# Patient Record
Sex: Female | Born: 1950 | Race: Black or African American | Hispanic: No | Smoking: Never smoker
Health system: Southern US, Community
[De-identification: ages and names within clinical notes are randomized; demographics above are authoritative.]

## PROBLEM LIST (undated history)

## (undated) HISTORY — PX: APPENDECTOMY: SHX54

---

## 1997-03-09 ENCOUNTER — Ambulatory Visit (HOSPITAL_COMMUNITY): Admission: RE | Admit: 1997-03-09 | Discharge: 1997-03-09 | Payer: Self-pay | Admitting: Obstetrics & Gynecology

## 1997-05-13 ENCOUNTER — Ambulatory Visit (HOSPITAL_COMMUNITY): Admission: RE | Admit: 1997-05-13 | Discharge: 1997-05-13 | Payer: Self-pay | Admitting: Obstetrics & Gynecology

## 2002-06-19 ENCOUNTER — Emergency Department (HOSPITAL_COMMUNITY): Admission: EM | Admit: 2002-06-19 | Discharge: 2002-06-19 | Payer: Self-pay | Admitting: Emergency Medicine

## 2002-06-19 ENCOUNTER — Encounter: Payer: Self-pay | Admitting: Emergency Medicine

## 2003-08-03 ENCOUNTER — Encounter: Admission: RE | Admit: 2003-08-03 | Discharge: 2003-11-01 | Payer: Self-pay | Admitting: Internal Medicine

## 2004-12-12 ENCOUNTER — Ambulatory Visit (HOSPITAL_COMMUNITY): Admission: RE | Admit: 2004-12-12 | Discharge: 2004-12-12 | Payer: Self-pay | Admitting: General Surgery

## 2005-07-04 ENCOUNTER — Emergency Department (HOSPITAL_COMMUNITY): Admission: EM | Admit: 2005-07-04 | Discharge: 2005-07-05 | Payer: Self-pay | Admitting: Emergency Medicine

## 2005-07-16 ENCOUNTER — Ambulatory Visit (HOSPITAL_COMMUNITY): Admission: RE | Admit: 2005-07-16 | Discharge: 2005-07-16 | Payer: Self-pay | Admitting: General Surgery

## 2006-08-14 ENCOUNTER — Other Ambulatory Visit: Admission: RE | Admit: 2006-08-14 | Discharge: 2006-08-14 | Payer: Self-pay | Admitting: Family Medicine

## 2007-03-25 IMAGING — US US PELVIS COMPLETE MODIFY
1 series · 14 of 25 positions shown · non-contrast
Comparison: None.

CLINICAL DATA: 54-year-old, pelvic pain.  
 TRANSABDOMINAL AND TRANSVAGINAL PELVIC ULTRASOUND:
TECHNIQUE: Both transabdominal and transvaginal ultrasound examinations of the pelvis were performed including evaluation of the uterus, ovaries, adnexal regions, and pelvic cul-de-sac.

[Series 1: us pelvis complete modify · 14 of 34 slices shown]
[im 1/34]
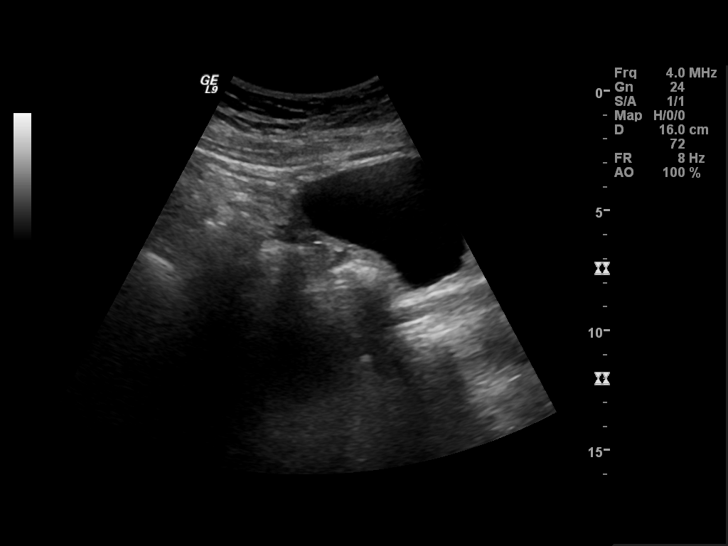
[im 3/34]
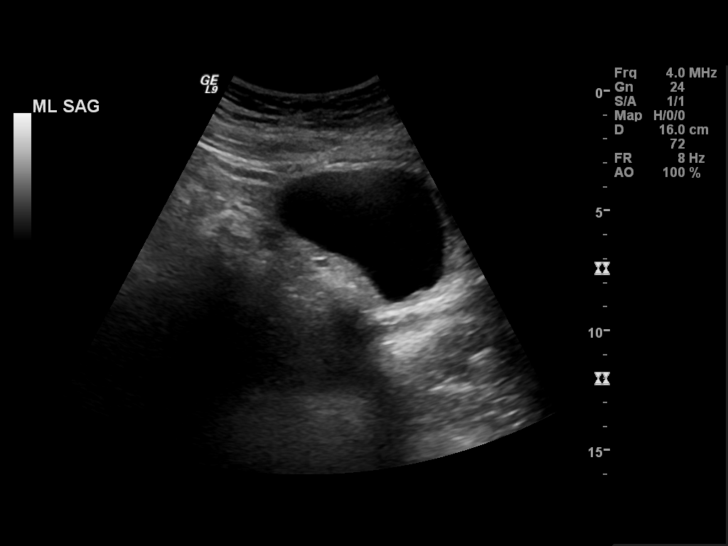
[im 6/34]
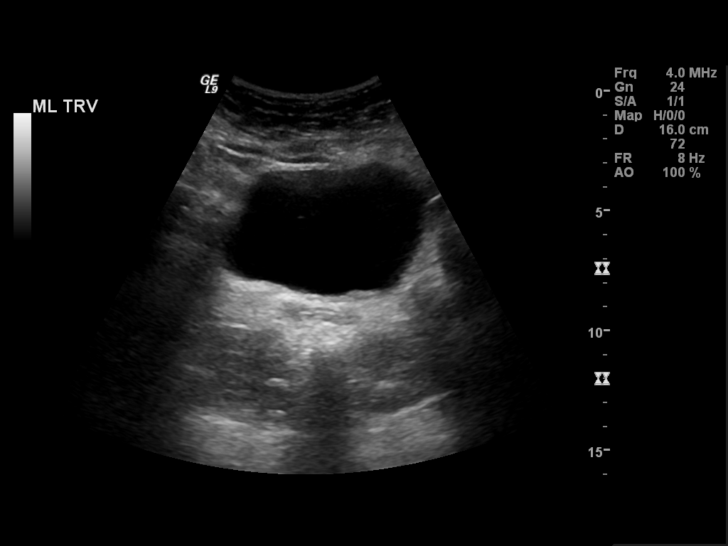
[im 9/34]
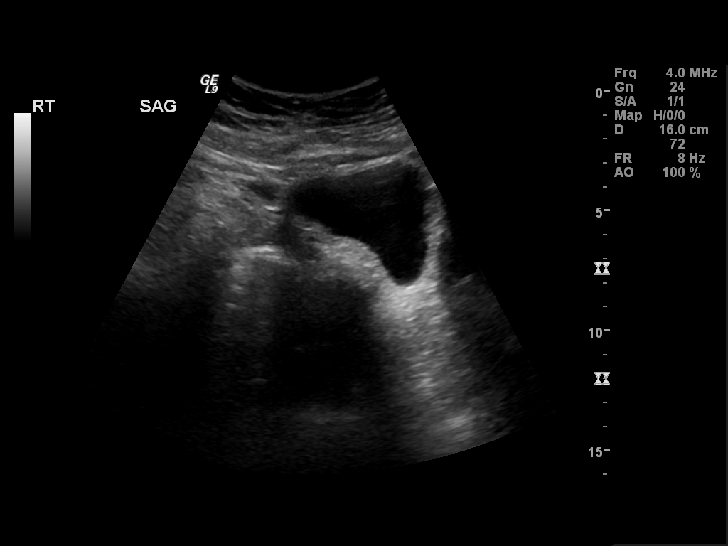
[im 12/34]
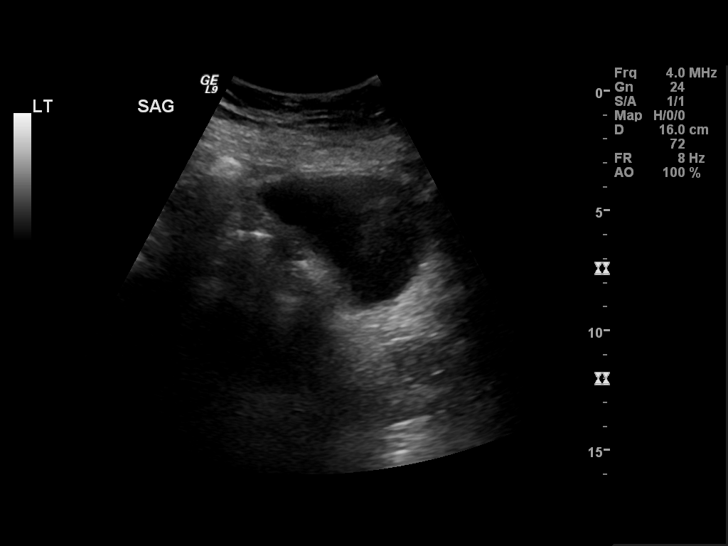
[im 13/34]
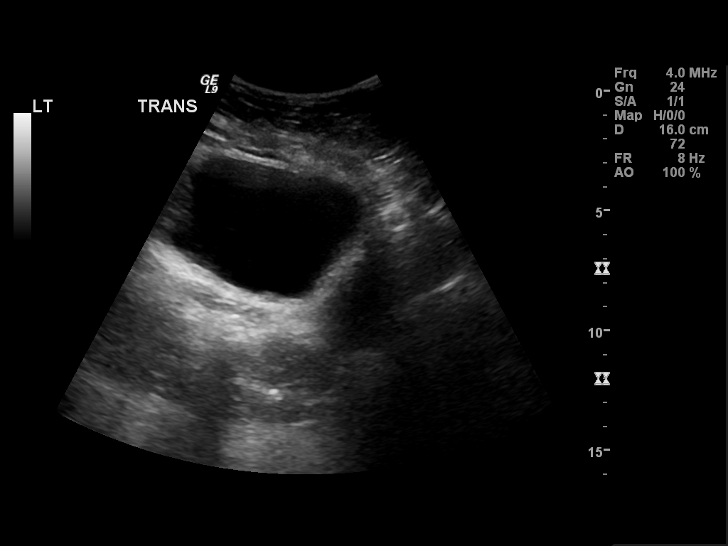
[im 16/34]
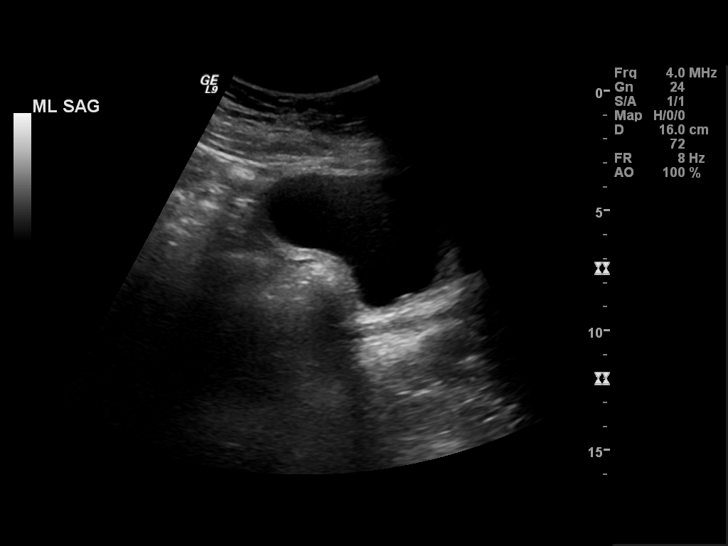
[im 18/34]
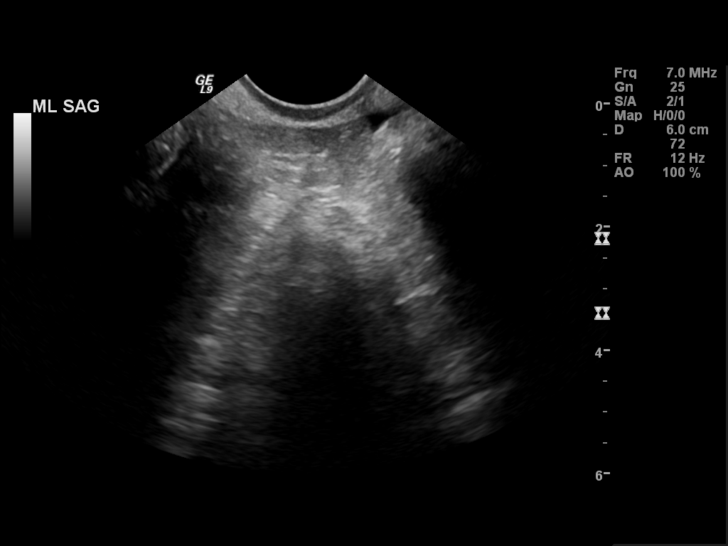
[im 21/34]
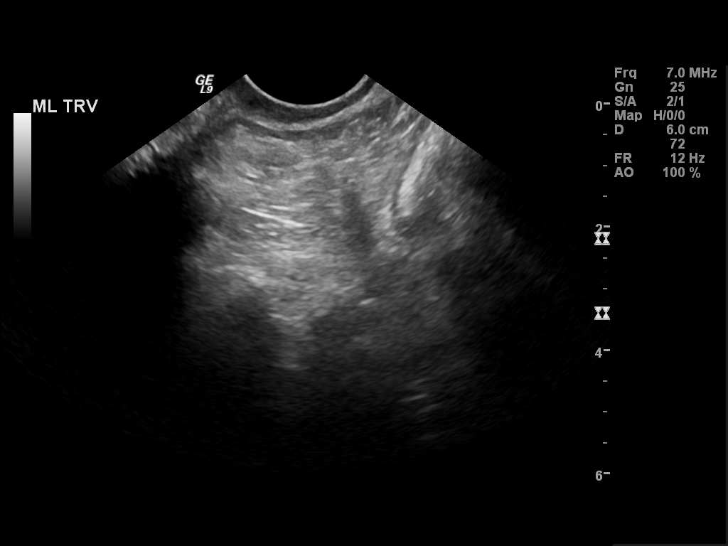
[im 23/34]
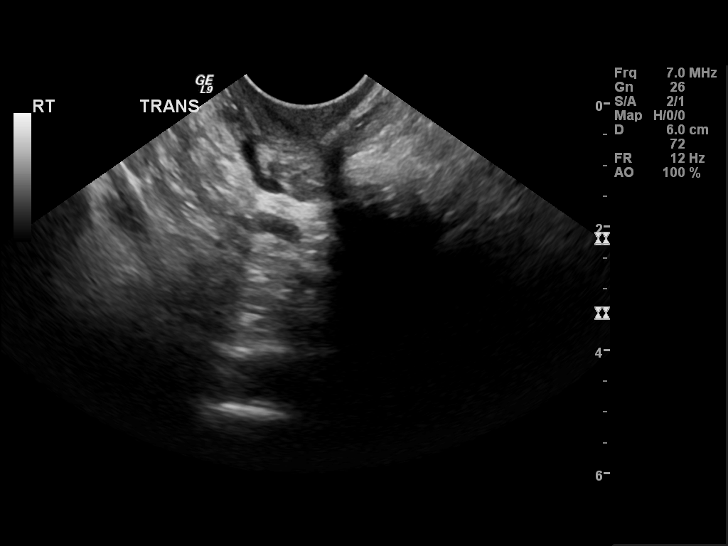
[im 25/34]
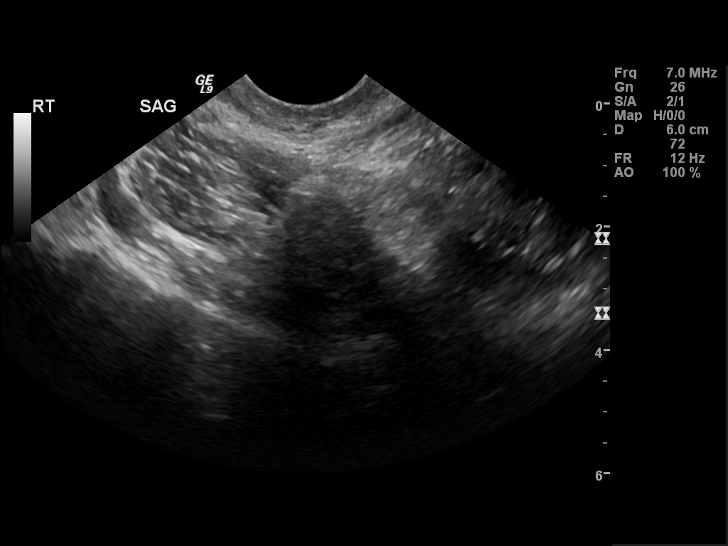
[im 28/34]
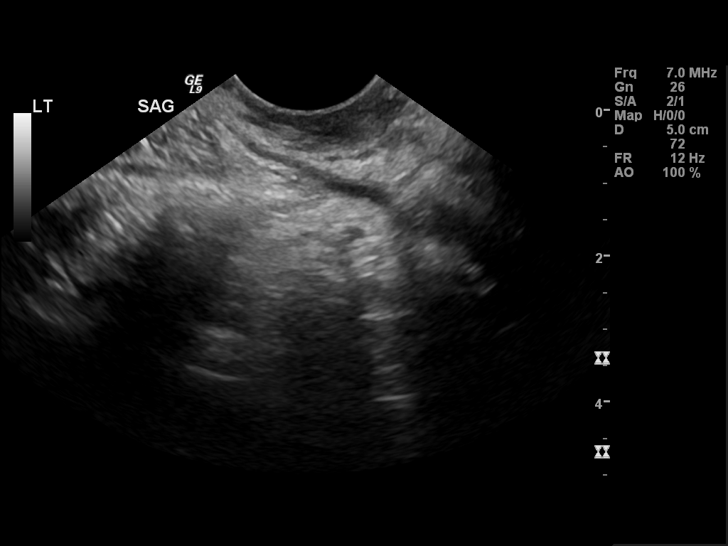
[im 31/34]
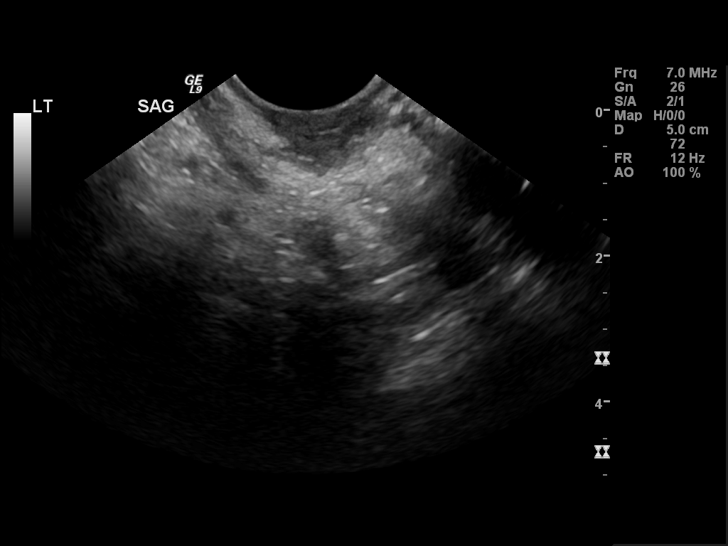
[im 34/34]
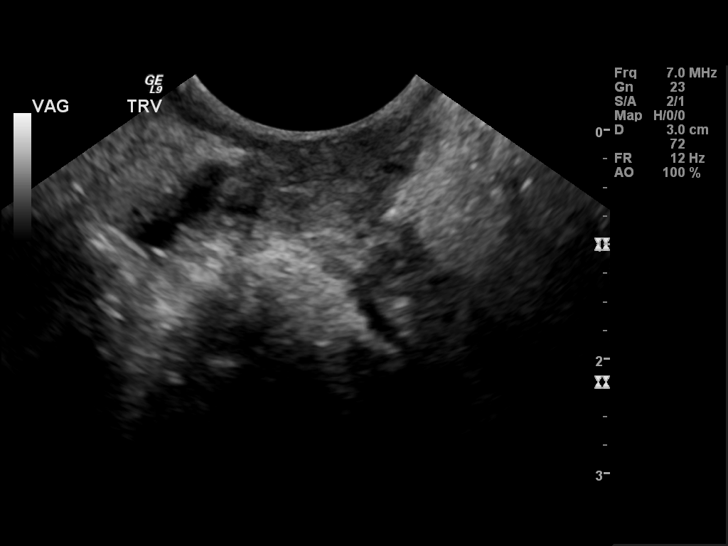

[14 of 25 positions shown; findings below may reference images not displayed]

FINDINGS: The patient has had a hysterectomy.  Neither ovary could be visualized.  Trace of free fluid in the pelvis.  No pelvic masses are seen.
IMPRESSION: Limited ultrasound post hysterectomy.   Neither ovary could be visualized for certain.  No definite pelvic masses are seen.  Bladder appears normal.

## 2010-02-10 ENCOUNTER — Encounter: Payer: Self-pay | Admitting: General Surgery

## 2010-02-11 ENCOUNTER — Encounter: Payer: Self-pay | Admitting: General Surgery

## 2019-02-27 ENCOUNTER — Emergency Department (HOSPITAL_COMMUNITY)
Admission: EM | Admit: 2019-02-27 | Discharge: 2019-02-28 | Disposition: A | Discharge status: Home / Self Care | Payer: Medicare PPO | Attending: Emergency Medicine | Admitting: Emergency Medicine

## 2019-02-27 ENCOUNTER — Other Ambulatory Visit: Payer: Self-pay

## 2019-02-27 DIAGNOSIS — Z20822 Contact with and (suspected) exposure to covid-19: Secondary | ICD-10-CM | POA: Diagnosis not present

## 2019-02-27 DIAGNOSIS — F22 Delusional disorders: Secondary | ICD-10-CM | POA: Diagnosis present

## 2019-02-27 LAB — CBC WITH DIFFERENTIAL/PLATELET
Abs Immature Granulocytes: 0.01 10*3/uL (ref 0.00–0.07)
Basophils Absolute: 0 10*3/uL (ref 0.0–0.1)
Basophils Relative: 1 %
Eosinophils Absolute: 0.2 10*3/uL (ref 0.0–0.5)
Eosinophils Relative: 4 %
HCT: 37.3 % (ref 36.0–46.0)
Hemoglobin: 11.9 g/dL — ABNORMAL LOW (ref 12.0–15.0)
Immature Granulocytes: 0 %
Lymphocytes Relative: 42 %
Lymphs Abs: 1.9 10*3/uL (ref 0.7–4.0)
MCH: 30.4 pg (ref 26.0–34.0)
MCHC: 31.9 g/dL (ref 30.0–36.0)
MCV: 95.4 fL (ref 80.0–100.0)
Monocytes Absolute: 0.3 10*3/uL (ref 0.1–1.0)
Monocytes Relative: 6 %
Neutro Abs: 2.2 10*3/uL (ref 1.7–7.7)
Neutrophils Relative %: 47 %
Platelets: 220 10*3/uL (ref 150–400)
RBC: 3.91 MIL/uL (ref 3.87–5.11)
RDW: 12.8 % (ref 11.5–15.5)
WBC: 4.6 10*3/uL (ref 4.0–10.5)
nRBC: 0 % (ref 0.0–0.2)

## 2019-02-27 LAB — URINALYSIS, ROUTINE W REFLEX MICROSCOPIC
Bacteria, UA: NONE SEEN
Bilirubin Urine: NEGATIVE
Glucose, UA: NEGATIVE mg/dL
Hgb urine dipstick: NEGATIVE
Ketones, ur: NEGATIVE mg/dL
Nitrite: NEGATIVE
Protein, ur: NEGATIVE mg/dL
Specific Gravity, Urine: 1.005 (ref 1.005–1.030)
pH: 7 (ref 5.0–8.0)

## 2019-02-27 LAB — RAPID URINE DRUG SCREEN, HOSP PERFORMED
Amphetamines: NOT DETECTED
Barbiturates: NOT DETECTED
Benzodiazepines: NOT DETECTED
Cocaine: NOT DETECTED
Opiates: NOT DETECTED
Tetrahydrocannabinol: NOT DETECTED

## 2019-02-27 LAB — BASIC METABOLIC PANEL
Anion gap: 9 (ref 5–15)
BUN: 19 mg/dL (ref 8–23)
CO2: 26 mmol/L (ref 22–32)
Calcium: 9.7 mg/dL (ref 8.9–10.3)
Chloride: 106 mmol/L (ref 98–111)
Creatinine, Ser: 0.99 mg/dL (ref 0.44–1.00)
GFR calc Af Amer: >60 mL/min (ref >60)
GFR calc non Af Amer: 59 mL/min — ABNORMAL LOW (ref >60)
Glucose, Bld: 84 mg/dL (ref 70–99)
Potassium: 4.2 mmol/L (ref 3.5–5.1)
Sodium: 141 mmol/L (ref 135–145)

## 2019-02-27 LAB — RESPIRATORY PANEL BY RT PCR (FLU A&B, COVID)
Influenza A by PCR: NEGATIVE
Influenza B by PCR: NEGATIVE
SARS Coronavirus 2 by RT PCR: NEGATIVE

## 2019-02-27 LAB — ETHANOL: Alcohol, Ethyl (B): <10 mg/dL (ref <10)

## 2019-02-27 NOTE — BH Assessment (Addendum)
Tele Assessment Note   Patient Name: Angel Joseph MRN: [id number] Referring Physician: Virgel Manifold, MD Location of Patient: [facility], [phone] Location of Provider: Behavioral Health TTS Department  Angel Joseph is an 69 y.o. widowed female who presents unaccompanied to [facility] after being petitioned for involuntary commitment by her son, Altagracia Rone [phone] . Affidavit and petition states: "Respondent has not been diagnosied with any mental disorder but is showing signs of paranoia. Respondent believes the [place] has planted or activated weapons against her. Respondent planted a firearm in step-daughter car. Respondent is easily agitated."  Pt states she doesn't know why her son petitioned for involuntary commitment. She says she has been staying with him because he has been ill. She denies they had any conflicts. Pt describes her mood recently as "good." She denies depressive symptoms. She denies problems with sleep or appetite. She denies current suicidal ideation or history of suicide attempts. Protective factors against suicide include good family support, future orientation, religious convictions, no current suicidal ideation and no prior attempts. Pt denies any history of intentional self-injurious behaviors. Pt denies current homicidal ideation or history of violence. Pt denies any history of auditory or visual hallucinations. Pt denies history of alcohol or other substance use.  Pt reports she is staying with her son because he has been ill recently. The only stressor she describes is feeling that the neighborhood has gone down hill and that kids have been "getting into mischief." She says her husband died in 2007/06/01. She denies any history of abuse. She denies current legal problems. She denies access to firearms. She identifies her son and two aunts as her primary support. She says she is involved with [employer]. She denies any history of inpatient or  outpatient mental health treatment.   TTS contacted Pt's son/petitioner Mauri [place] via telephone. He says he has noticed for the past 1-2 years that Pt has been more "paranoid." He says she tells stories indicating that people are against her. She believes there are neighbors who own assault weapons. She has made comments that people may be tracking her. Pt says was caused him to petition for involuntary commitment was finding a gun in the back seat of his wife's car. They they didn't know Pt had a gun but believe Pt left it there. They are unable to explain why she might have done this. When asked how Pt responded when they asked her about the gun, Mr Polhamus says they didn't tell her. He denies Pt has made verbal threats to harm anyone. He denies Pt has been physically aggressive. He denies Pt has made any suicidal threats. He denies Pt has appeared confused or responding to internal stimuli. When asked if Mr Manni has discussed mental health treatment with his mother, he says he has not had that conversation. Mr Bennetts says he is concerned about his mother's paranoia and having a gun.  Pt is dressed in hospital scrubs, alert and oriented x4. Pt speaks in a clear tone, at moderate volume and normal pace. Motor behavior appears normal. Eye contact is good. Pt's mood is euthymic and affect is congruent with mood. Thought process is coherent and relevant. There is no indication Pt is currently responding to internal stimuli or experiencing delusional thought content. Pt was pleasant and cooperative throughout assessment. Pt says she doesn't believe she needs mental health treatment.  Diagnosis: Deferred  Past Medical History: No past medical history on file.    Family History:  No family history on file.  Social History:  has no history on file for tobacco, alcohol, and drug.  Additional Social History:  Alcohol / Drug Use Pain Medications: Denies abuse Prescriptions: Denies abuse Over the Counter:  Denies abuse History of alcohol / drug use?: No history of alcohol / drug abuse Longest period of sobriety (when/how long): NA  CIWA: CIWA-Ar BP: 123/75 Pulse Rate: 60 COWS:    Allergies: Not on File  Home Medications: (Not in a hospital admission)   OB/GYN Status:  No LMP recorded.  General Assessment Data Location of Assessment: [facility] TTS Assessment: In system Is this a Tele or Face-to-Face Assessment?: Tele Assessment Is this an Initial Assessment or a Re-assessment for this encounter?: Initial Assessment Patient Accompanied by:: N/A Language Other than English: No Living Arrangements: Other (Comment)(Lives alone, currently staying with son) What gender do you identify as?: Female Marital status: Widowed [place] name: NA Pregnancy Status: No Living Arrangements: Alone Can pt return to current living arrangement?: Yes Admission Status: Involuntary Petitioner: Family member Is patient capable of signing voluntary admission?: Yes Referral Source: Self/Family/Friend Insurance type: [employer]     Crisis Care Plan Living Arrangements: Alone Legal Guardian: Other:(Self) Name of Psychiatrist: None Name of Therapist: None  Education Status Is patient currently in school?: No Is the patient employed, unemployed or receiving disability?: Unemployed  Risk to self with the past 6 months Suicidal Ideation: No Has patient been a risk to self within the past 6 months prior to admission? : No Suicidal Intent: No Has patient had any suicidal intent within the past 6 months prior to admission? : No Is patient at risk for suicide?: No Suicidal Plan?: No Has patient had any suicidal plan within the past 6 months prior to admission? : No Access to Means: No What has been your use of drugs/alcohol within the last 12 months?: Pt denies  Previous Attempts/Gestures: No How many times?: 0 Other Self Harm Risks: None identified Triggers for Past Attempts: None  known Intentional Self Injurious Behavior: None Family Suicide History: No Recent stressful life event(s): Other (Comment)(Son's illness) Persecutory voices/beliefs?: No Depression: No Depression Symptoms: (Pt denies depressive symptoms) Substance abuse history and/or treatment for substance abuse?: No Suicide prevention information given to non-admitted patients: Not applicable  Risk to Others within the past 6 months Homicidal Ideation: No Does patient have any lifetime risk of violence toward others beyond the six months prior to admission? : No Thoughts of Harm to Others: No Current Homicidal Intent: No Current Homicidal Plan: No Access to Homicidal Means: No Identified Victim: None History of harm to others?: No Assessment of Violence: None Noted Violent Behavior Description: Pt denies history of violence Does patient have access to weapons?: No Criminal Charges Pending?: No Does patient have a court date: No Is patient on probation?: No  Psychosis Hallucinations: None noted Delusions: None noted  Mental Status Report Appearance/Hygiene: In scrubs Eye Contact: Good Motor Activity: Unremarkable Speech: Logical/coherent Level of Consciousness: Alert Mood: Euthymic Affect: Appropriate to circumstance Anxiety Level: None Thought Processes: Coherent, Relevant Judgement: Unimpaired Orientation: Person, Place, Time, Situation Obsessive Compulsive Thoughts/Behaviors: None  Cognitive Functioning Concentration: Normal Memory: Recent Intact, Remote Intact Is patient IDD: No Insight: Good Impulse Control: Good Appetite: Good Have you had any weight changes? : No Change Sleep: No Change Total Hours of Sleep: 8 Vegetative Symptoms: None  ADLScreening [facility] Assessment Services) Patient's cognitive ability adequate to safely complete daily activities?: Yes Patient able to express need for assistance  with ADLs?: Yes Independently performs ADLs?: Yes (appropriate for  developmental age)  Prior Inpatient Therapy Prior Inpatient Therapy: No  Prior Outpatient Therapy Prior Outpatient Therapy: No Does patient have an ACCT team?: No Does patient have Intensive In-House Services?  : No Does patient have [facility] services? : No Does patient have P4CC services?: No  ADL Screening (condition at time of admission) Patient's cognitive ability adequate to safely complete daily activities?: Yes Is the patient deaf or have difficulty hearing?: No Does the patient have difficulty seeing, even when wearing glasses/contacts?: No Does the patient have difficulty concentrating, remembering, or making decisions?: No Patient able to express need for assistance with ADLs?: Yes Does the patient have difficulty dressing or bathing?: No Independently performs ADLs?: Yes (appropriate for developmental age) Does the patient have difficulty walking or climbing stairs?: No Weakness of Legs: None Weakness of Arms/Hands: None  Home Assistive Devices/Equipment Home Assistive Devices/Equipment: None    Abuse/Neglect Assessment (Assessment to be complete while patient is alone) Abuse/Neglect Assessment Can Be Completed: Yes Physical Abuse: Denies Verbal Abuse: Denies Sexual Abuse: Denies Exploitation of patient/patient's resources: Denies Self-Neglect: Denies     Merchant navy officer (For Healthcare) Does Patient Have a Medical Advance Directive?: No Would patient like information on creating a medical advance directive?: No - Patient declined          Disposition: Gave clinical report to Nira Conn, FNP who said Pt does not meet criteria for involuntary commitment and recommends Pt be discharged with outpatient referrals. Notified Dr. April Palumbo of recommendation.  Disposition Initial Assessment Completed for this Encounter: Yes  This service was provided via telemedicine using a 2-way, interactive audio and video technology.  Names of all persons  participating in this telemedicine service and their role in this encounter. Name: HAILE TOPPINS Role: Patient  Name: Henry Russel Role: Pt's son/petitioner  Name: Shela Commons, [facility] Role: TTS counselor      Harlin Rain Patsy [place], [facility], [facility] Triage Specialist [phone]  Pamalee Leyden 02/27/2019 10:48 PM

## 2019-02-27 NOTE — ED Triage Notes (Signed)
Patient arrived with [employer] from home. Patient was IVC' by her son. Per [employer] son reported patient was showing paranoia.  Pt denies with SI/HI.  Pt a/o x4 , Calm and cooperative.

## 2019-02-27 NOTE — ED Provider Notes (Signed)
[facility]-EMERGENCY DEPT Provider Note   CSN: [id number] Arrival date & time: 02/27/19  2103     History Chief Complaint  Patient presents with  . Medical Clearance    Angel Joseph is a 69 y.o. female.  HPI   69 year old female coming from home.  She was involuntarily committed by her son.  Setting paranoid behavior.  She tells me that she is not sure why she is here.  She states that she was merely talking with her son about something she had observed.  She states that magnetic waves interfere with her using her keep a log in her car.  She feels like some of her neighbors are trying to bully her and get her to move from the neighborhood.  She states that "everyone has these electronic devices these days that mess with everything."  She is somewhat evasive.  She denies thoughts of wanting harm herself or anyone else.  No past medical history on file.  There are no problems to display for this patient.   OB History   No obstetric history on file.     No family history on file.  Social History   Tobacco Use  . Smoking status: Not on file  Substance Use Topics  . Alcohol use: Not on file  . Drug use: Not on file    Home Medications Prior to Admission medications   Not on File    Allergies    Patient has no allergy information on record.  Review of Systems   Review of Systems All systems reviewed and negative, other than as noted in HPI.  Physical Exam Updated Vital Signs BP 123/75 (BP Location: Right Arm)   Pulse 60   Temp 98.8 F (37.1 C) (Oral)   Resp 20   SpO2 100%   Physical Exam Vitals and nursing note reviewed.  Constitutional:      General: She is not in acute distress.    Appearance: She is well-developed.  HENT:     Head: Normocephalic and atraumatic.  Eyes:     General:        Right eye: No discharge.        Left eye: No discharge.     Conjunctiva/sclera: Conjunctivae normal.  Cardiovascular:     Rate and Rhythm:  Normal rate and regular rhythm.     Heart sounds: Normal heart sounds. No murmur. No friction rub. No gallop.   Pulmonary:     Effort: Pulmonary effort is normal. No respiratory distress.     Breath sounds: Normal breath sounds.  Abdominal:     General: There is no distension.     Palpations: Abdomen is soft.     Tenderness: There is no abdominal tenderness.  Musculoskeletal:        General: No tenderness.     Cervical back: Neck supple.  Skin:    General: Skin is warm and dry.  Neurological:     Mental Status: She is alert.  Psychiatric:        Behavior: Behavior normal.        Thought Content: Thought content normal.     Comments: Calm.  Good eye contact.  Speech is clear.  Somewhat evasive to questioning.     ED Results / Procedures / Treatments   Labs (all labs ordered are listed, but only abnormal results are displayed) Labs Reviewed  CBC WITH DIFFERENTIAL/PLATELET - Abnormal; Notable for the following components:      Result  Value   Hemoglobin 11.9 (*)    All other components within normal limits  BASIC METABOLIC PANEL - Abnormal; Notable for the following components:   GFR calc non Af Amer 59 (*)    All other components within normal limits  URINALYSIS, ROUTINE W REFLEX MICROSCOPIC - Abnormal; Notable for the following components:   Color, Urine STRAW (*)    Leukocytes,Ua SMALL (*)    All other components within normal limits  RESPIRATORY PANEL BY RT PCR (FLU A&B, COVID)  ETHANOL  RAPID URINE DRUG SCREEN, HOSP PERFORMED    EKG None  Radiology No results found.  Procedures Procedures (including critical care time)  Medications Ordered in ED Medications - No data to display  ED Course  I have reviewed the triage vital signs and the nursing notes.  Pertinent labs & imaging results that were available during my care of the patient were reviewed by me and considered in my medical decision making (see chart for details).    MDM Rules/Calculators/A&P                       69 year old female with some bizarre behavior and paranoia.  She apparently does not have a diagnosed psychiatric history.  She is generally calm.  Denies suicidal or homicidal ideation to me.  She has been medically cleared at this time.  TTS evaluation.  Disposition per their recommendations.  Final Clinical Impression(s) / ED Diagnoses Final diagnoses:  Paranoia [facility])    Rx / [place] Orders ED Discharge Orders    None       Virgel Manifold, MD 02/28/19 0004

## 2019-08-27 ENCOUNTER — Ambulatory Visit (HOSPITAL_COMMUNITY): Payer: Medicare PPO

## 2019-08-27 ENCOUNTER — Ambulatory Visit (HOSPITAL_COMMUNITY)
Admission: EM | Admit: 2019-08-27 | Discharge: 2019-08-27 | Disposition: A | Discharge status: Home / Self Care | Payer: Medicare PPO | Attending: Family Medicine | Admitting: Family Medicine

## 2019-08-27 ENCOUNTER — Other Ambulatory Visit: Payer: Self-pay

## 2019-08-27 ENCOUNTER — Encounter (HOSPITAL_COMMUNITY): Payer: Self-pay

## 2019-08-27 DIAGNOSIS — E860 Dehydration: Secondary | ICD-10-CM | POA: Insufficient documentation

## 2019-08-27 DIAGNOSIS — R05 Cough: Secondary | ICD-10-CM | POA: Insufficient documentation

## 2019-08-27 DIAGNOSIS — U071 COVID-19: Secondary | ICD-10-CM | POA: Insufficient documentation

## 2019-08-27 DIAGNOSIS — R059 Cough, unspecified: Secondary | ICD-10-CM

## 2019-08-27 LAB — CBC WITH DIFFERENTIAL/PLATELET
Abs Immature Granulocytes: 0.02 10*3/uL (ref 0.00–0.07)
Basophils Absolute: 0 10*3/uL (ref 0.0–0.1)
Basophils Relative: 0 %
Eosinophils Absolute: 0 10*3/uL (ref 0.0–0.5)
Eosinophils Relative: 0 %
HCT: 34.7 % — ABNORMAL LOW (ref 36.0–46.0)
Hemoglobin: 11.2 g/dL — ABNORMAL LOW (ref 12.0–15.0)
Immature Granulocytes: 0 %
Lymphocytes Relative: 23 %
Lymphs Abs: 1.1 10*3/uL (ref 0.7–4.0)
MCH: 28.8 pg (ref 26.0–34.0)
MCHC: 32.3 g/dL (ref 30.0–36.0)
MCV: 89.2 fL (ref 80.0–100.0)
Monocytes Absolute: 0.2 10*3/uL (ref 0.1–1.0)
Monocytes Relative: 5 %
Neutro Abs: 3.2 10*3/uL (ref 1.7–7.7)
Neutrophils Relative %: 72 %
Platelets: 184 10*3/uL (ref 150–400)
RBC: 3.89 MIL/uL (ref 3.87–5.11)
RDW: 13.2 % (ref 11.5–15.5)
WBC: 4.5 10*3/uL (ref 4.0–10.5)
nRBC: 0 % (ref 0.0–0.2)

## 2019-08-27 LAB — COMPREHENSIVE METABOLIC PANEL
ALT: 30 U/L (ref 0–44)
AST: 49 U/L — ABNORMAL HIGH (ref 15–41)
Albumin: 3.1 g/dL — ABNORMAL LOW (ref 3.5–5.0)
Alkaline Phosphatase: 37 U/L — ABNORMAL LOW (ref 38–126)
Anion gap: 12 (ref 5–15)
BUN: 10 mg/dL (ref 8–23)
CO2: 24 mmol/L (ref 22–32)
Calcium: 9.2 mg/dL (ref 8.9–10.3)
Chloride: 95 mmol/L — ABNORMAL LOW (ref 98–111)
Creatinine, Ser: 1.02 mg/dL — ABNORMAL HIGH (ref 0.44–1.00)
GFR calc Af Amer: >60 mL/min (ref >60)
GFR calc non Af Amer: 57 mL/min — ABNORMAL LOW (ref >60)
Glucose, Bld: 89 mg/dL (ref 70–99)
Potassium: 3.6 mmol/L (ref 3.5–5.1)
Sodium: 131 mmol/L — ABNORMAL LOW (ref 135–145)
Total Bilirubin: 0.8 mg/dL (ref 0.3–1.2)
Total Protein: 7 g/dL (ref 6.5–8.1)

## 2019-08-27 MED ORDER — SODIUM CHLORIDE 0.9 % IV SOLN: Freq: Once | INTRAVENOUS | Status: AC

## 2019-08-27 MED ORDER — DEXAMETHASONE SODIUM PHOSPHATE 10 MG/ML IJ SOLN
10.0000 mg | Freq: Once | INTRAMUSCULAR | Status: AC
  Administered 2019-08-27: 10 mg via INTRAMUSCULAR

## 2019-08-27 MED ORDER — DEXAMETHASONE SODIUM PHOSPHATE 10 MG/ML IJ SOLN
INTRAMUSCULAR | Status: AC
  Filled 2019-08-27: qty 1

## 2019-08-27 MED ORDER — ONDANSETRON 4 MG PO TBDP
4.0000 mg | ORAL_TABLET | Freq: Three times a day (TID) | ORAL | 0 refills | Status: AC | PRN
Start: 2019-08-27 — End: ?

## 2019-08-27 MED ORDER — BENZONATATE 200 MG PO CAPS
200.0000 mg | ORAL_CAPSULE | Freq: Three times a day (TID) | ORAL | 0 refills | Status: AC | PRN
Start: 2019-08-27 — End: 2019-09-03

## 2019-08-27 NOTE — Discharge Instructions (Signed)
Drink plenty of fluids Tessalon for cough Zofran for nausea/appetite Follow up if not improving or worsening

## 2019-08-27 NOTE — ED Triage Notes (Signed)
Pt c/o nonproductive cough x 2 weeks. Tested positive for COVID 2 weeks ago at [facility].

## 2019-08-27 NOTE — ED Provider Notes (Signed)
[facility]    CSN: [id number] Arrival date & time: 08/27/19  1216      History   Chief Complaint Chief Complaint  Patient presents with  . Cough  . Fatigue    HPI Angel Joseph is a 69 y.o. female presenting today for evaluation of cough and fatigue.  Patient has been positive for Covid 2 weeks ago.  She reports that for the past 2 weeks she has had a persistent cough, as well as significant fatigue and low appetite.  She reports very poor oral intake over the past couple of weeks.  She denies significant shortness of breath, but does complain of persistent cough.  HPI  History reviewed. No pertinent past medical history.  There are no problems to display for this patient.   Past Surgical History:  Procedure Laterality Date  . APPENDECTOMY      OB History   No obstetric history on file.      Home Medications    Prior to Admission medications   Medication Sig Start Date End Date Taking? Authorizing Provider  benzonatate (TESSALON) 200 MG capsule Take 1 capsule (200 mg total) by mouth 3 (three) times daily as needed for up to 7 days for cough. 08/27/19 09/03/19  Allanna Bresee C, PA-C  ondansetron (ZOFRAN ODT) 4 MG disintegrating tablet Take 1 tablet (4 mg total) by mouth every 8 (eight) hours as needed for nausea or vomiting. 08/27/19   Katriona Schmierer, Junius Creamer, PA-C    Family History History reviewed. No pertinent family history.  Social History Social History   Tobacco Use  . Smoking status: Never Smoker  . Smokeless tobacco: Never Used  Substance Use Topics  . Alcohol use: Never  . Drug use: Never     Allergies   Penicillins   Review of Systems Review of Systems  Constitutional: Positive for activity change, appetite change and fatigue. Negative for chills and fever.  HENT: Negative for congestion, ear pain, rhinorrhea, sinus pressure, sore throat and trouble swallowing.   Eyes: Negative for discharge and redness.  Respiratory: Positive for  cough. Negative for chest tightness and shortness of breath.   Cardiovascular: Negative for chest pain.  Gastrointestinal: Negative for abdominal pain, diarrhea, nausea and vomiting.  Musculoskeletal: Negative for myalgias.  Skin: Negative for rash.  Neurological: Positive for light-headedness. Negative for dizziness and headaches.     Physical Exam Triage Vital Signs ED Triage Vitals  Enc Vitals Group     BP      Pulse      Resp      Temp      Temp src      SpO2      Weight      Height      Head Circumference      Peak Flow      Pain Score      Pain Loc      Pain Edu?      Excl. in GC?    No data found.  Updated Vital Signs BP 106/62   Pulse 74   Temp 98.9 F (37.2 C)   Resp 16   SpO2 98%   Visual Acuity Right Eye Distance:   Left Eye Distance:   Bilateral Distance:    Right Eye Near:   Left Eye Near:    Bilateral Near:     Physical Exam Vitals and nursing note reviewed.  Constitutional:      Appearance: She is well-developed.  Comments: No acute distress  HENT:     Head: Normocephalic and atraumatic.     Ears:     Comments: Bilateral ears without tenderness to palpation of external auricle, tragus and mastoid, EAC's without erythema or swelling, TM's with good bony landmarks and cone of light. Non erythematous.     Nose: Nose normal.     Mouth/Throat:     Comments: Oral mucosa pink and moist, no tonsillar enlargement or exudate. Posterior pharynx patent and nonerythematous, no uvula deviation or swelling. Normal phonation. Eyes:     Conjunctiva/sclera: Conjunctivae normal.  Cardiovascular:     Rate and Rhythm: Normal rate.  Pulmonary:     Effort: Pulmonary effort is normal. No respiratory distress.     Comments: Crackles to bilateral bases Abdominal:     General: There is no distension.  Musculoskeletal:        General: Normal range of motion.     Cervical back: Neck supple.  Skin:    General: Skin is warm and dry.  Neurological:      Mental Status: She is alert and oriented to person, place, and time.      UC Treatments / Results  Labs (all labs ordered are listed, but only abnormal results are displayed) Labs Reviewed  CBC WITH DIFFERENTIAL/PLATELET - Abnormal; Notable for the following components:      Result Value   Hemoglobin 11.2 (*)    HCT 34.7 (*)    All other components within normal limits  COMPREHENSIVE METABOLIC PANEL - Abnormal; Notable for the following components:   Sodium 131 (*)    Chloride 95 (*)    Creatinine, Ser 1.02 (*)    Albumin 3.1 (*)    AST 49 (*)    Alkaline Phosphatase 37 (*)    GFR calc non Af Amer 57 (*)    All other components within normal limits    EKG   Radiology No results found.  Procedures Procedures (including critical care time)  Medications Ordered in UC Medications  0.9 %  sodium chloride infusion ( Intravenous Stopped 08/27/19 1619)  dexamethasone (DECADRON) injection 10 mg (10 mg Intramuscular Given 08/27/19 1620)    Initial Impression / Assessment and Plan / UC Course  I have reviewed the triage vital signs and the nursing notes.  Pertinent labs & imaging results that were available during my care of the patient were reviewed by me and considered in my medical decision making (see chart for details).  Clinical Course as of Aug 26 1649  Fri Aug 27, 2019  1432 O2 rechecked after triage and was reading approximately 97%, ambulated patient around room and was remaining stable at 96, but did drop to 93%, did not break 92%.  Patient declined shortness of breath with ambulation.   [HW]    Clinical Course User Index [HW] Omkar Stratmann C, PA-C    Recommended chest x-ray, patient concerned about radiation, discussed risks versus benefit, patient continues to decline x-ray.  Proceeding with fluids and basic labs.  Will monitor for blood pressure to improve with 1 L of fluids.  Blood work consistent with dehydration.  Patient symptomatically improved after  fluid bolus.  Blood pressure mildly improved.  Patient is not tachycardic.  Recommending continued oral rehydration at home, push fluids.  Tessalon for cough, decadron prior to discharge.  Patient declining x-ray.  O2 stable.  Discussed strict return precautions. Patient verbalized understanding and is agreeable with plan.    Final Clinical Impressions(s) / UC Diagnoses  Final diagnoses:  COVID-19  Dehydration  Cough     Discharge Instructions     Drink plenty of fluids Tessalon for cough Zofran for nausea/appetite Follow up if not improving or worsening    ED Prescriptions    Medication Sig Dispense Auth. Provider   ondansetron (ZOFRAN ODT) 4 MG disintegrating tablet Take 1 tablet (4 mg total) by mouth every 8 (eight) hours as needed for nausea or vomiting. 20 tablet Parissa Chiao C, PA-C   benzonatate (TESSALON) 200 MG capsule Take 1 capsule (200 mg total) by mouth 3 (three) times daily as needed for up to 7 days for cough. 28 capsule Shaylie Eklund, [place] C, PA-C     PDMP not reviewed this encounter.   Lew Dawes, [place] 08/27/19 1651

## 2019-10-10 ENCOUNTER — Other Ambulatory Visit: Payer: Self-pay

## 2019-10-10 ENCOUNTER — Encounter (HOSPITAL_COMMUNITY): Payer: Self-pay

## 2019-10-10 ENCOUNTER — Ambulatory Visit (HOSPITAL_COMMUNITY)
Admission: EM | Admit: 2019-10-10 | Discharge: 2019-10-10 | Disposition: A | Discharge status: Home / Self Care | Payer: Medicare PPO | Attending: Family Medicine | Admitting: Family Medicine

## 2019-10-10 DIAGNOSIS — Z23 Encounter for immunization: Secondary | ICD-10-CM

## 2019-10-10 DIAGNOSIS — S91211A Laceration without foreign body of right great toe with damage to nail, initial encounter: Secondary | ICD-10-CM | POA: Diagnosis not present

## 2019-10-10 DIAGNOSIS — M79674 Pain in right toe(s): Secondary | ICD-10-CM

## 2019-10-10 MED ORDER — TETANUS-DIPHTH-ACELL PERTUSSIS 5-2.5-18.5 LF-MCG/0.5 IM SUSP
0.5000 mL | Freq: Once | INTRAMUSCULAR | Status: AC
  Administered 2019-10-10: 0.5 mL via INTRAMUSCULAR

## 2019-10-10 MED ORDER — CEPHALEXIN 500 MG PO CAPS: 500.0000 mg | ORAL_CAPSULE | Freq: Two times a day (BID) | ORAL | 0 refills | Status: AC

## 2019-10-10 MED ORDER — BACITRACIN ZINC 500 UNIT/GM EX OINT: 1.0000 "application " | TOPICAL_OINTMENT | Freq: Two times a day (BID) | CUTANEOUS | 0 refills | Status: AC

## 2019-10-10 MED ORDER — TETANUS-DIPHTH-ACELL PERTUSSIS 5-2.5-18.5 LF-MCG/0.5 IM SUSP
INTRAMUSCULAR | Status: AC
  Filled 2019-10-10: qty 0.5

## 2019-10-10 NOTE — ED Provider Notes (Signed)
[facility]    CSN: [id number] Arrival date & time: 10/10/19  1004      History   Chief Complaint Chief Complaint  Patient presents with  . Laceration    right foot/big toe    HPI Angel Joseph is a 69 y.o. female.   Reports that she cut her toenail off of the right great toe with lawnmower blade yesterday while cutting her grass. Reports that she has wrapped the area, and that she thinks that it still bleeding.  Reports pain to the right great toe.  Reports that she has elevated the foot and used ice to the area.  Has not tried OTC medications.  There are no alleviating or aggravating factors.  Denies headache, cough, shortness of breath, nausea, vomiting, diarrhea, rash, fever, other symptoms.  ROS per HPI  The history is provided by the patient.  Laceration   History reviewed. No pertinent past medical history.  There are no problems to display for this patient.   Past Surgical History:  Procedure Laterality Date  . APPENDECTOMY      OB History   No obstetric history on file.      Home Medications    Prior to Admission medications   Medication Sig Start Date End Date Taking? Authorizing Provider  bacitracin ointment Apply 1 application topically 2 (two) times daily. 10/10/19   Moshe Cipro, NP  cephALEXin (KEFLEX) 500 MG capsule Take 1 capsule (500 mg total) by mouth 2 (two) times daily for 7 days. 10/10/19 10/17/19  Moshe Cipro, NP  ondansetron (ZOFRAN ODT) 4 MG disintegrating tablet Take 1 tablet (4 mg total) by mouth every 8 (eight) hours as needed for nausea or vomiting. 08/27/19   Wieters, Junius Creamer, PA-C    Family History History reviewed. No pertinent family history.  Social History Social History   Tobacco Use  . Smoking status: Never Smoker  . Smokeless tobacco: Never Used  Substance Use Topics  . Alcohol use: Never  . Drug use: Never     Allergies   Penicillins   Review of Systems Review of Systems   Physical  Exam Triage Vital Signs ED Triage Vitals  Enc Vitals Group     BP 10/10/19 1021 114/75     Pulse Rate 10/10/19 1021 (!) 58     Resp 10/10/19 1021 18     Temp 10/10/19 1021 98.4 F (36.9 C)     Temp Source 10/10/19 1021 Oral     SpO2 10/10/19 1021 100 %     Weight --      Height --      Head Circumference --      Peak Flow --      Pain Score 10/10/19 1151 0     Pain Loc --      Pain Edu? --      Excl. in GC? --    No data found.  Updated Vital Signs BP 114/75   Pulse (!) 58   Temp 98.4 F (36.9 C) (Oral)   Resp 18   SpO2 100%   Visual Acuity Right Eye Distance:   Left Eye Distance:   Bilateral Distance:    Right Eye Near:   Left Eye Near:    Bilateral Near:     Physical Exam Vitals and nursing note reviewed.  Constitutional:      General: She is not in acute distress.    Appearance: Normal appearance. She is well-developed. She is not ill-appearing.  HENT:  Head: Normocephalic and atraumatic.     Nose: Nose normal.     Mouth/Throat:     Mouth: Mucous membranes are moist.     Pharynx: Oropharynx is clear.  Eyes:     Conjunctiva/sclera: Conjunctivae normal.     Pupils: Pupils are equal, round, and reactive to light.  Cardiovascular:     Rate and Rhythm: Normal rate and regular rhythm.     Heart sounds: Normal heart sounds. No murmur heard.   Pulmonary:     Effort: Pulmonary effort is normal. No respiratory distress.     Breath sounds: Normal breath sounds.  Abdominal:     Palpations: Abdomen is soft.     Tenderness: There is no abdominal tenderness.  Musculoskeletal:        General: Normal range of motion.     Cervical back: Neck supple.  Skin:    General: Skin is warm and dry.     Capillary Refill: Capillary refill takes less than 2 seconds.     Findings: Laceration present.     Comments: Laceration to right great toe nailbed.  Right great toenail is missing.  There is a jagged laceration to the nailbed.  There is an area of oozing the medial  aspect of the nailbed.  No foreign bodies visualized.  Neurological:     General: No focal deficit present.     Mental Status: She is alert and oriented to person, place, and time.  Psychiatric:        Mood and Affect: Mood normal.        Behavior: Behavior normal.        Thought Content: Thought content normal.      UC Treatments / Results  Labs (all labs ordered are listed, but only abnormal results are displayed) Labs Reviewed - No data to display  EKG   Radiology No results found.  Procedures Procedures (including critical care time)  Medications Ordered in UC Medications  Tdap (BOOSTRIX) injection 0.5 mL (0.5 mLs Intramuscular Given 10/10/19 1125)    Initial Impression / Assessment and Plan / UC Course  I have reviewed the triage vital signs and the nursing notes.  Pertinent labs & imaging results that were available during my care of the patient were reviewed by me and considered in my medical decision making (see chart for details).      Right great toe pain Laceration to right great toe  Presents to the office today with right great toe pain following right great toe injury with lawnmower blade Right great toe nail is traumatically absent Nailbed is still oozing Silver nitrate applied to medial aspect of right great toe nailbed Instructed patient to keep the area clean Wound cleansed, no foreign bodies appreciated, dressed in the office Tdap vaccine updated in office today Prescribed Keflex Take as directed and to completion Prescribed bacitracin Use to the area with dressing changes as needed Follow-up this office or primary care if symptoms are persisting or if the wound is not healing well Follow-up with the ER for red streaking up the foot or leg, high fever, trouble swallowing, trouble breathing, other concerning symptoms   Final Clinical Impressions(s) / UC Diagnoses   Final diagnoses:  Toe pain, right  Laceration of right great toe without  foreign body with damage to nail, initial encounter     Discharge Instructions     I have applied silver nitrate to the nailbed to stop the oozing  We have applied a nonstick dressing.  I would have you change this twice a day.  Apply bacitracin to the area, otherwise keep it clean and dry.  Do this for the next 2 days.  Then I would have you change to a gauze dressing and change this twice a day and as needed.  In between dressing changes you may leave the area open to air to help try and heal  I have sent in Keflex for you to take twice a day for 7 days  You have received a tetanus vaccine in the office today  Follow-up with this office or with primary care if the area is not healing or if you notice an increase in drainage, other concerns    ED Prescriptions    Medication Sig Dispense Auth. Provider   cephALEXin (KEFLEX) 500 MG capsule Take 1 capsule (500 mg total) by mouth 2 (two) times daily for 7 days. 14 capsule Moshe Cipro, NP   bacitracin ointment Apply 1 application topically 2 (two) times daily. 120 g Moshe Cipro, NP     PDMP not reviewed this encounter.   Moshe Cipro, NP 10/10/19 1322

## 2019-10-10 NOTE — Discharge Instructions (Signed)
I have applied silver nitrate to the nailbed to stop the oozing  We have applied a nonstick dressing.  I would have you change this twice a day.  Apply bacitracin to the area, otherwise keep it clean and dry.  Do this for the next 2 days.  Then I would have you change to a gauze dressing and change this twice a day and as needed.  In between dressing changes you may leave the area open to air to help try and heal  I have sent in Keflex for you to take twice a day for 7 days  You have received a tetanus vaccine in the office today  Follow-up with this office or with primary care if the area is not healing or if you notice an increase in drainage, other concerns

## 2019-10-10 NOTE — ED Triage Notes (Signed)
Pt present laceration to her right foot/big toe. She was cutting the grass and cut her foot.

## 2021-04-09 DIAGNOSIS — S20219A Contusion of unspecified front wall of thorax, initial encounter: Secondary | ICD-10-CM | POA: Diagnosis not present

## 2021-04-09 DIAGNOSIS — R079 Chest pain, unspecified: Secondary | ICD-10-CM | POA: Diagnosis not present

## 2021-04-09 DIAGNOSIS — S0990XA Unspecified injury of head, initial encounter: Secondary | ICD-10-CM | POA: Diagnosis not present
